# Patient Record
Sex: Male | Born: 1950 | Race: Black or African American | Hispanic: No | Smoking: Former smoker
Health system: Southern US, Community
[De-identification: ages and names within clinical notes are randomized; demographics above are authoritative.]

## PROBLEM LIST (undated history)

## (undated) DIAGNOSIS — G43909 Migraine, unspecified, not intractable, without status migrainosus: Secondary | ICD-10-CM

## (undated) DIAGNOSIS — I10 Essential (primary) hypertension: Secondary | ICD-10-CM

## (undated) DIAGNOSIS — I509 Heart failure, unspecified: Secondary | ICD-10-CM

---

## 2013-07-22 ENCOUNTER — Emergency Department (HOSPITAL_BASED_OUTPATIENT_CLINIC_OR_DEPARTMENT_OTHER)
Admission: EM | Admit: 2013-07-22 | Discharge: 2013-07-22 | Disposition: A | Discharge status: Home / Self Care | Payer: Medicare Other | Attending: Emergency Medicine | Admitting: Emergency Medicine

## 2013-07-22 ENCOUNTER — Encounter (HOSPITAL_BASED_OUTPATIENT_CLINIC_OR_DEPARTMENT_OTHER): Payer: Self-pay | Admitting: Emergency Medicine

## 2013-07-22 ENCOUNTER — Emergency Department (HOSPITAL_BASED_OUTPATIENT_CLINIC_OR_DEPARTMENT_OTHER): Payer: Medicare Other

## 2013-07-22 DIAGNOSIS — Y929 Unspecified place or not applicable: Secondary | ICD-10-CM | POA: Insufficient documentation

## 2013-07-22 DIAGNOSIS — F17200 Nicotine dependence, unspecified, uncomplicated: Secondary | ICD-10-CM | POA: Insufficient documentation

## 2013-07-22 DIAGNOSIS — Y939 Activity, unspecified: Secondary | ICD-10-CM | POA: Insufficient documentation

## 2013-07-22 DIAGNOSIS — W010XXA Fall on same level from slipping, tripping and stumbling without subsequent striking against object, initial encounter: Secondary | ICD-10-CM | POA: Insufficient documentation

## 2013-07-22 DIAGNOSIS — S8000XA Contusion of unspecified knee, initial encounter: Secondary | ICD-10-CM | POA: Insufficient documentation

## 2013-07-22 MED ORDER — HYDROCODONE-ACETAMINOPHEN 5-325 MG PO TABS: 1.0000 | ORAL_TABLET | Freq: Four times a day (QID) | ORAL | Status: DC | PRN

## 2013-07-22 MED ORDER — IBUPROFEN 600 MG PO TABS: 600.0000 mg | ORAL_TABLET | Freq: Four times a day (QID) | ORAL | Status: DC | PRN

## 2013-07-22 NOTE — ED Notes (Signed)
Pt tripped and fell to knees yesterday on cement.  Knee pain started today.  Worse on right.  Pt walking unassisted without difficulty.

## 2013-07-22 NOTE — ED Provider Notes (Signed)
CSN: [id number]     Arrival date & time 07/22/13  2020 History  This chart was scribed for Shon Baton, MD by Danella Maiers, ED Scribe. This patient was seen in room MH07/MH07 and the patient's care was started at 8:37 PM.   Chief Complaint  Patient presents with  . Fall  . Knee Pain   The history is provided by the patient. No language interpreter was used.   HPI Comments: Andrew Warner is a 62 y.o. male who presents to the Emergency Department complaining of bilateral knee pain, worse on the right, since tripping and falling onto cement yesterday. He rates the severity of his pain as 10/10. He tried taking Aleve with no relief. He is able to ambulate fine. He denies pain before the fall. He denies injury anywhere else. He denies LOC. He is not on any blood thinners. He is otherwise healthy. He is not on any medications currently. He has no allergies to medications.   History reviewed. No pertinent past medical history. History reviewed. No pertinent past surgical history. No family history on file. History  Substance Use Topics  . Smoking status: Current Some Day Smoker  . Smokeless tobacco: Not on file  . Alcohol Use: Yes     Comment: occ    Review of Systems  Musculoskeletal: Positive for arthralgias (knee).  Neurological: Negative for syncope.    Allergies  Review of patient's allergies indicates no known allergies.  Home Medications   Current Outpatient Rx  Name  Route  Sig  Dispense  Refill  . HYDROcodone-acetaminophen (NORCO/VICODIN) 5-325 MG per tablet   Oral   Take 1 tablet by mouth every 6 (six) hours as needed.   10 tablet   0   . ibuprofen (ADVIL,MOTRIN) 600 MG tablet   Oral   Take 1 tablet (600 mg total) by mouth every 6 (six) hours as needed.   30 tablet   0    BP 163/91  Pulse 80  Temp(Src) 98.3 F (36.8 C) (Oral)  Resp 18  Ht 6\' 3"  (1.905 m)  Wt 225 lb (102.059 kg)  BMI 28.12 kg/m2  SpO2 99% Physical Exam  Nursing note and vitals  reviewed. Constitutional: He is oriented to person, place, and time. He appears well-developed and well-nourished.  HENT:  Head: Normocephalic and atraumatic.  Eyes: Pupils are equal, round, and reactive to light.  Neck: Neck supple.  Cardiovascular: Normal rate, regular rhythm and normal heart sounds.   No murmur heard. Pulmonary/Chest: Effort normal and breath sounds normal. No respiratory distress. He has no wheezes.  Abdominal: Soft. Bowel sounds are normal. There is no tenderness. There is no rebound.  Musculoskeletal: He exhibits no edema.  Examination of the bilateral knees shows full range of motion. There is a small abrasion over the left knee. No evidence of swelling or effusion.  Lymphadenopathy:    He has no cervical adenopathy.  Neurological: He is alert and oriented to person, place, and time.  Skin: Skin is warm and dry.  Psychiatric: He has a normal mood and affect.    ED Course  Procedures (including critical care time) Medications - No data to display  DIAGNOSTIC STUDIES: Oxygen Saturation is 99% on RA, normal by my interpretation.    COORDINATION OF CARE: 8:49 PM- Discussed treatment plan with pt which includes x-ray and pain medication. Pt agrees to plan.    Labs Review Labs Reviewed - No data to display Imaging Review Dg Knee Complete 4 Views Left  07/22/2013   CLINICAL DATA:  Left knee pain after fall yesterday.  EXAM: LEFT KNEE - COMPLETE 4+ VIEW  COMPARISON:  None.  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 No acute fracture or dislocation. No joint effusion.  IMPRESSION: Degenerative change, without acute osseous finding.   Electronically Signed   By: Jeronimo Greaves M.D.   On: 07/22/2013 21:15   Dg Knee Complete 4 Views Right  07/22/2013   CLINICAL DATA:  Pain after fall yesterday.  EXAM: RIGHT KNEE - COMPLETE 4+ VIEW  COMPARISON:  None.  FINDINGS: Mild medial and lateral compartment  joint space narrowing and osteophyte formation. Mild to moderate patellofemoral osteoarthritis. No acute fracture or dislocation. No joint effusion.  IMPRESSION: Degenerative change, without acute osseous finding.   Electronically Signed   By: Jeronimo Greaves M.D.   On: 07/22/2013 21:16    EKG Interpretation   None       MDM   1. Knee contusion, unspecified laterality, initial encounter    Patient presents with bilateral knee pain following a fall. He is otherwise nontoxic-appearing. He has no evidence of deformity, effusion, or decreased range of motion of bilateral knees. Plain films are negative. Patient is driving so was not given any pain medication in the ED. He will be sent home with a prescription for ibuprofen and Norco. I encouraged RICE therapy.  After history, exam, and medical workup I feel the patient has been appropriately medically screened and is safe for discharge home. Pertinent diagnoses were discussed with the patient. Patient was given return precautions.     I personally performed the services described in this documentation, which was scribed in my presence. The recorded information has been reviewed and is accurate.   Shon Baton, MD 07/23/13 [phone]

## 2016-02-13 ENCOUNTER — Emergency Department (HOSPITAL_BASED_OUTPATIENT_CLINIC_OR_DEPARTMENT_OTHER): Payer: Medicare Other

## 2016-02-13 ENCOUNTER — Emergency Department (HOSPITAL_BASED_OUTPATIENT_CLINIC_OR_DEPARTMENT_OTHER)
Admission: EM | Admit: 2016-02-13 | Discharge: 2016-02-13 | Disposition: A | Discharge status: Home / Self Care | Payer: Medicare Other | Attending: Emergency Medicine | Admitting: Emergency Medicine

## 2016-02-13 ENCOUNTER — Encounter (HOSPITAL_BASED_OUTPATIENT_CLINIC_OR_DEPARTMENT_OTHER): Payer: Self-pay | Admitting: Emergency Medicine

## 2016-02-13 DIAGNOSIS — G44039 Episodic paroxysmal hemicrania, not intractable: Secondary | ICD-10-CM | POA: Diagnosis not present

## 2016-02-13 DIAGNOSIS — Z79899 Other long term (current) drug therapy: Secondary | ICD-10-CM | POA: Insufficient documentation

## 2016-02-13 DIAGNOSIS — F17200 Nicotine dependence, unspecified, uncomplicated: Secondary | ICD-10-CM | POA: Insufficient documentation

## 2016-02-13 DIAGNOSIS — R51 Headache: Secondary | ICD-10-CM | POA: Diagnosis present

## 2016-02-13 MED ORDER — KETOROLAC TROMETHAMINE 60 MG/2ML IM SOLN
60.0000 mg | Freq: Once | INTRAMUSCULAR | Status: AC
  Administered 2016-02-13: 60 mg via INTRAMUSCULAR
  Filled 2016-02-13: qty 2

## 2016-02-13 MED ORDER — TRAMADOL HCL 50 MG PO TABS: 50.0000 mg | ORAL_TABLET | Freq: Four times a day (QID) | ORAL | Status: AC | PRN

## 2016-02-13 NOTE — ED Notes (Signed)
MD at bedside. 

## 2016-02-13 NOTE — ED Notes (Addendum)
Pt intermittent HA x 4 days. Pt describes pain as throbbing on the top of his head. Pt denies blurred vision or dizziness. Pt states pain eases with tylenol but returns. Pt denies weakness in arms or legs, denies N/V

## 2016-02-13 NOTE — ED Provider Notes (Signed)
CSN: [id number]     Arrival date & time 02/13/16  0744 History   First MD Initiated Contact with Patient 02/13/16 [phone]     Chief Complaint  Patient presents with  . Headache     (Consider location/radiation/quality/duration/timing/severity/associated sxs/prior Treatment) HPI Comments: Patient presents with a headache. He has remote history of esophageal cancer. He states she's been in remission for the last 8 years. He states over the last 4 days he's had ongoing headache. It wakes him up at night. It eases off the Tylenol but then comes back. He denies any vision changes. No nausea or vomiting. No photophobia. No numbness or weakness to his extremities. No ataxia. No recent head trauma. No URI symptoms.  Patient is a 65 y.o. male presenting with headaches.  Headache Associated symptoms: no abdominal pain, no back pain, no congestion, no cough, no diarrhea, no dizziness, no fatigue, no fever, no nausea, no numbness, no vomiting and no weakness     History reviewed. No pertinent past medical history. History reviewed. No pertinent past surgical history. No family history on file. Social History  Substance Use Topics  . Smoking status: Current Some Day Smoker  . Smokeless tobacco: None  . Alcohol Use: Yes     Comment: occ    Review of Systems  Constitutional: Negative for fever, chills, diaphoresis and fatigue.  HENT: Negative for congestion, rhinorrhea and sneezing.   Eyes: Negative.   Respiratory: Negative for cough, chest tightness and shortness of breath.   Cardiovascular: Negative for chest pain and leg swelling.  Gastrointestinal: Negative for nausea, vomiting, abdominal pain, diarrhea and blood in stool.  Genitourinary: Negative for frequency, hematuria, flank pain and difficulty urinating.  Musculoskeletal: Negative for back pain and arthralgias.  Skin: Negative for rash.  Neurological: Positive for headaches. Negative for dizziness, speech difficulty, weakness and  numbness.      Allergies  Review of patient's allergies indicates no known allergies.  Home Medications   Prior to Admission medications   Medication Sig Start Date End Date Taking? Authorizing Provider  varenicline (CHANTIX PAK) 0.5 MG X 11 & 1 MG X 42 tablet Take by mouth 2 (two) times daily. Take one 0.5 mg tablet by mouth once daily for 3 days, then increase to one 0.5 mg tablet twice daily for 4 days, then increase to one 1 mg tablet twice daily.   Yes Historical Provider, MD  HYDROcodone-acetaminophen (NORCO/VICODIN) 5-325 MG per tablet Take 1 tablet by mouth every 6 (six) hours as needed. 07/22/13   Shon Batonourtney F Horton, MD  ibuprofen (ADVIL,MOTRIN) 600 MG tablet Take 1 tablet (600 mg total) by mouth every 6 (six) hours as needed. 07/22/13   Shon Batonourtney F Horton, MD   BP 161/88 mmHg  Pulse 69  Temp(Src) 98.2 F (36.8 C) (Oral)  Resp 16  Ht 6\' 3"  (1.905 m)  Wt 230 lb (104.327 kg)  BMI 28.75 kg/m2  SpO2 98% Physical Exam  Constitutional: He is oriented to person, place, and time. He appears well-developed and well-nourished.  HENT:  Head: Normocephalic and atraumatic.  Eyes: Pupils are equal, round, and reactive to light.  Neck: Normal range of motion. Neck supple.  Cardiovascular: Normal rate, regular rhythm and normal heart sounds.   Pulmonary/Chest: Effort normal and breath sounds normal. No respiratory distress. He has no wheezes. He has no rales. He exhibits no tenderness.  Abdominal: Soft. Bowel sounds are normal. There is no tenderness. There is no rebound and no guarding.  Musculoskeletal: Normal range of  motion. He exhibits no edema.  Lymphadenopathy:    He has no cervical adenopathy.  Neurological: He is alert and oriented to person, place, and time.  Motor 5/5 all extremities, sensation intact to LT all extremities, FTN intact, no pronator drift.  Gait normal.  CN 2-12 grossly intact  Skin: Skin is warm and dry. No rash noted.  Psychiatric: He has a normal mood and  affect.    ED Course  Procedures (including critical care time) Labs Review Labs Reviewed - No data to display  Imaging Review Ct Head Wo Contrast  02/13/2016  CLINICAL DATA:  Headaches at vertex for 4 days, smoker, remote history esophageal cancer EXAM: CT HEAD WITHOUT CONTRAST TECHNIQUE: Contiguous axial images were obtained from the base of the skull through the vertex without intravenous contrast. COMPARISON:  None FINDINGS: Normal ventricular morphology. No midline shift or mass effect. Normal appearance of brain parenchyma. No intracranial hemorrhage, mass lesion or evidence acute infarction. No extra-axial fluid collections. Paranasal sinuses mastoid air cells clear. Bones unremarkable. Scattered nonspecific subcutaneous nodularity of scalp at vertex. IMPRESSION: No acute intracranial abnormalities. Electronically Signed   By: Ulyses SouthwardMark  Boles M.D.   On: 02/13/2016 09:59   I have personally reviewed and evaluated these images and lab results as part of my medical decision-making.   EKG Interpretation None      MDM   Final diagnoses:  Nonintractable paroxysmal hemicrania, unspecified chronicity pattern    Patient presents with a four-day history of a headache, right-sided. There is no neurologic deficits. No suggestions of subarachnoid hemorrhage or meningitis. Given his remote history of cancer, I did do a head CT which was negative. He was given dose of Toradol in the ED and feels better after this. His blood pressure is mildly elevated and I advised him he needs to follow-up with his PCP to have this rechecked. Return precautions were given.    Rolan BuccoMelanie Nation Cradle, MD 02/13/16 [phone]

## 2016-02-14 ENCOUNTER — Emergency Department (HOSPITAL_BASED_OUTPATIENT_CLINIC_OR_DEPARTMENT_OTHER)
Admission: EM | Admit: 2016-02-14 | Discharge: 2016-02-14 | Disposition: A | Discharge status: Home / Self Care | Payer: Medicare Other | Attending: Emergency Medicine | Admitting: Emergency Medicine

## 2016-02-14 ENCOUNTER — Encounter (HOSPITAL_BASED_OUTPATIENT_CLINIC_OR_DEPARTMENT_OTHER): Payer: Self-pay | Admitting: *Deleted

## 2016-02-14 DIAGNOSIS — G44019 Episodic cluster headache, not intractable: Secondary | ICD-10-CM | POA: Diagnosis not present

## 2016-02-14 DIAGNOSIS — R51 Headache: Secondary | ICD-10-CM | POA: Diagnosis present

## 2016-02-14 DIAGNOSIS — F17200 Nicotine dependence, unspecified, uncomplicated: Secondary | ICD-10-CM | POA: Insufficient documentation

## 2016-02-14 LAB — CBC WITH DIFFERENTIAL/PLATELET
BASOS ABS: 0 10*3/uL (ref 0.0–0.1)
BASOS PCT: 0 %
EOS ABS: 0.2 10*3/uL (ref 0.0–0.7)
Eosinophils Relative: 3 %
HCT: 42.1 % (ref 39.0–52.0)
HEMOGLOBIN: 14.1 g/dL (ref 13.0–17.0)
Lymphocytes Relative: 39 %
Lymphs Abs: 2.8 10*3/uL (ref 0.7–4.0)
MCH: 33.6 pg (ref 26.0–34.0)
MCHC: 33.5 g/dL (ref 30.0–36.0)
MCV: 100.2 fL — ABNORMAL HIGH (ref 78.0–100.0)
MONOS PCT: 9 %
Monocytes Absolute: 0.7 10*3/uL (ref 0.1–1.0)
NEUTROS ABS: 3.5 10*3/uL (ref 1.7–7.7)
Neutrophils Relative %: 49 %
Platelets: 185 10*3/uL (ref 150–400)
RBC: 4.2 MIL/uL — ABNORMAL LOW (ref 4.22–5.81)
RDW: 12 % (ref 11.5–15.5)
WBC: 7.3 10*3/uL (ref 4.0–10.5)

## 2016-02-14 LAB — BASIC METABOLIC PANEL
ANION GAP: 8 (ref 5–15)
BUN: 17 mg/dL (ref 6–20)
CALCIUM: 8.7 mg/dL — AB (ref 8.9–10.3)
CO2: 27 mmol/L (ref 22–32)
CREATININE: 1.23 mg/dL (ref 0.61–1.24)
Chloride: 102 mmol/L (ref 101–111)
Glucose, Bld: 118 mg/dL — ABNORMAL HIGH (ref 65–99)
Potassium: 4.2 mmol/L (ref 3.5–5.1)
Sodium: 137 mmol/L (ref 135–145)

## 2016-02-14 MED ORDER — KETOROLAC TROMETHAMINE 15 MG/ML IJ SOLN
15.0000 mg | Freq: Once | INTRAMUSCULAR | Status: AC
  Administered 2016-02-14: 15 mg via INTRAVENOUS
  Filled 2016-02-14: qty 1

## 2016-02-14 NOTE — ED Notes (Signed)
O2 applied via [facility]B 12L for HA treatment and not respiritory decline.

## 2016-02-14 NOTE — ED Notes (Signed)
Dr. Molpus in to see pt 

## 2016-02-14 NOTE — ED Notes (Addendum)
C/o HA, onset 1-2 hrs ago, seen here yesterday for the same, no relief with 1-200mg  ibuprofen taken at 0030. (denies: fever, nvd, blurred vision, dizziness, light sensitivity, dental pain, sinus issues or other sx or hx).  Rates 10-10. Family here with pt (in w/r). 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

## 2016-02-14 NOTE — ED Provider Notes (Signed)
CSN: [id number]     Arrival date & time 02/14/16  0450 History   First MD Initiated Contact with Patient 02/14/16 0510     Chief Complaint  Patient presents with  . Headache     (Consider location/radiation/quality/duration/timing/severity/associated sxs/prior Treatment) HPI  This is a 65 year old male without a long-standing history of headaches. He has been having headaches for the last 5 days. The headaches usually occur in the evening. The pain is located in the right parietal region. He describes the pain as severe.   He is here with a headache that began about 1 AM. He took 200 milligrams of ibuprofen without relief. The pain is again located in the right parietal region. His scalp is not tender to touch. There is no associated photophobia, blurred vision, nausea, vomiting, numbness or weakness.  He was seen yesterday for the same and had relief with IM Toradol.   History reviewed. No pertinent past medical history. History reviewed. No pertinent past surgical history. History reviewed. No pertinent family history. Social History  Substance Use Topics  . Smoking status: Current Some Day Smoker  . Smokeless tobacco: None  . Alcohol Use: Yes     Comment: occ    Review of Systems  All other systems reviewed and are negative.   Allergies  Review of patient's allergies indicates no known allergies.  Home Medications   Prior to Admission medications   Medication Sig Start Date End Date Taking? Authorizing Provider  HYDROcodone-acetaminophen (NORCO/VICODIN) 5-325 MG per tablet Take 1 tablet by mouth every 6 (six) hours as needed. 07/22/13   Shon Batonourtney F Horton, MD  ibuprofen (ADVIL,MOTRIN) 600 MG tablet Take 1 tablet (600 mg total) by mouth every 6 (six) hours as needed. 07/22/13   Shon Batonourtney F Horton, MD  traMADol (ULTRAM) 50 MG tablet Take 1 tablet (50 mg total) by mouth every 6 (six) hours as needed. 02/13/16   Rolan BuccoMelanie Belfi, MD  varenicline (CHANTIX PAK) 0.5 MG X 11 & 1 MG X 42  tablet Take by mouth 2 (two) times daily. Take one 0.5 mg tablet by mouth once daily for 3 days, then increase to one 0.5 mg tablet twice daily for 4 days, then increase to one 1 mg tablet twice daily.    Historical Provider, MD   BP 153/91 mmHg  Pulse 74  Temp(Src) 97.9 F (36.6 C) (Oral)  Resp 18  Ht 6\' 3"  (1.905 m)  Wt 230 lb (104.327 kg)  BMI 28.75 kg/m2  SpO2 95%   Physical Exam  General: Well-developed, well-nourished male in no acute distress; appearance consistent with age of record HENT: normocephalic; atraumatic; scalp nontender Eyes: pupils equal, round and reactive to light; extraocular muscles intact Neck: supple Heart: regular rate and rhythm Lungs: clear to auscultation bilaterally Abdomen: soft; nondistended; nontender; bowel sounds present Extremities: No deformity; full range of motion; pulses normal Neurologic: Awake, alert and oriented; motor function intact in all extremities and symmetric; no facial droop; normal coordination and speech Skin: Warm and dry Psychiatric: Normal mood and affect    ED Course  Procedures (including critical care time)   MDM  Nursing notes and vitals signs, including pulse oximetry, reviewed.  Summary of this visit's results, reviewed by myself:  Labs:  Results for orders placed or performed during the hospital encounter of 02/14/16 (from the past 24 hour(s))  Basic metabolic panel     Status: Abnormal   Collection Time: 02/14/16  5:25 AM  Result Value Ref Range   Sodium 137  135 - 145 mmol/L   Potassium 4.2 3.5 - 5.1 mmol/L   Chloride 102 101 - 111 mmol/L   CO2 27 22 - 32 mmol/L   Glucose, Bld 118 (H) 65 - 99 mg/dL   BUN 17 6 - 20 mg/dL   Creatinine, Ser 6.961.23 0.61 - 1.24 mg/dL   Calcium 8.7 (L) 8.9 - 10.3 mg/dL   GFR calc non Af Amer >60 >60 mL/min   GFR calc Af Amer >60 >60 mL/min   Anion gap 8 5 - 15  CBC with Differential/Platelet     Status: Abnormal   Collection Time: 02/14/16  5:25 AM  Result Value Ref Range    WBC 7.3 4.0 - 10.5 K/uL   RBC 4.20 (L) 4.22 - 5.81 MIL/uL   Hemoglobin 14.1 13.0 - 17.0 g/dL   HCT 29.542.1 28.439.0 - 13.252.0 %   MCV 100.2 (H) 78.0 - 100.0 fL   MCH 33.6 26.0 - 34.0 pg   MCHC 33.5 30.0 - 36.0 g/dL   RDW 44.012.0 10.211.5 - 72.515.5 %   Platelets 185 150 - 400 K/uL   Neutrophils Relative % 49 %   Neutro Abs 3.5 1.7 - 7.7 K/uL   Lymphocytes Relative 39 %   Lymphs Abs 2.8 0.7 - 4.0 K/uL   Monocytes Relative 9 %   Monocytes Absolute 0.7 0.1 - 1.0 K/uL   Eosinophils Relative 3 %   Eosinophils Absolute 0.2 0.0 - 0.7 K/uL   Basophils Relative 0 %   Basophils Absolute 0.0 0.0 - 0.1 K/uL    Imaging Studies: Ct Head Wo Contrast  02/13/2016  CLINICAL DATA:  Headaches at vertex for 4 days, smoker, remote history esophageal cancer EXAM: CT HEAD WITHOUT CONTRAST TECHNIQUE: Contiguous axial images were obtained from the base of the skull through the vertex without intravenous contrast. COMPARISON:  None FINDINGS: Normal ventricular morphology. No midline shift or mass effect. Normal appearance of brain parenchyma. No intracranial hemorrhage, mass lesion or evidence acute infarction. No extra-axial fluid collections. Paranasal sinuses mastoid air cells clear. Bones unremarkable. Scattered nonspecific subcutaneous nodularity of scalp at vertex. IMPRESSION: No acute intracranial abnormalities. Electronically Signed   By: Ulyses SouthwardMark  Boles M.D.   On: 02/13/2016 09:59   6:02 AM Patient's pain significantly improved after IV Toradol and oxygen by nonrebreather. I suspect these may represent cluster headaches as there are new onset, unilateral and occur at about the same time of day. The patient was advised of this and will contact his tremor care physician regarding longer term treatment. As sumatriptan has the potential for significant side effects I prefer that he be managed by the physician that knows him.    Paula LibraJohn Abbee Cremeens, MD 02/14/16 [phone]

## 2016-02-14 NOTE — ED Notes (Signed)
Dr. Molpus at BS 

## 2016-02-24 ENCOUNTER — Encounter (HOSPITAL_BASED_OUTPATIENT_CLINIC_OR_DEPARTMENT_OTHER): Payer: Self-pay | Admitting: *Deleted

## 2016-02-24 ENCOUNTER — Emergency Department (HOSPITAL_BASED_OUTPATIENT_CLINIC_OR_DEPARTMENT_OTHER)
Admission: EM | Admit: 2016-02-24 | Discharge: 2016-02-24 | Disposition: A | Discharge status: Home / Self Care | Payer: Medicare Other | Attending: Emergency Medicine | Admitting: Emergency Medicine

## 2016-02-24 DIAGNOSIS — F17200 Nicotine dependence, unspecified, uncomplicated: Secondary | ICD-10-CM | POA: Insufficient documentation

## 2016-02-24 DIAGNOSIS — R51 Headache: Secondary | ICD-10-CM | POA: Insufficient documentation

## 2016-02-24 DIAGNOSIS — R519 Headache, unspecified: Secondary | ICD-10-CM

## 2016-02-24 HISTORY — DX: Migraine, unspecified, not intractable, without status migrainosus: G43.909

## 2016-02-24 MED ORDER — SUMATRIPTAN SUCCINATE 50 MG PO TABS: 50.0000 mg | ORAL_TABLET | ORAL | Status: AC | PRN

## 2016-02-24 MED ORDER — SODIUM CHLORIDE 0.9 % IV BOLUS (SEPSIS)
1000.0000 mL | Freq: Once | INTRAVENOUS | Status: AC
  Administered 2016-02-24: 1000 mL via INTRAVENOUS

## 2016-02-24 MED ORDER — METOCLOPRAMIDE HCL 5 MG/ML IJ SOLN
10.0000 mg | Freq: Once | INTRAMUSCULAR | Status: AC
  Administered 2016-02-24: 10 mg via INTRAVENOUS
  Filled 2016-02-24: qty 2

## 2016-02-24 MED ORDER — KETOROLAC TROMETHAMINE 30 MG/ML IJ SOLN
30.0000 mg | Freq: Once | INTRAMUSCULAR | Status: AC
  Administered 2016-02-24: 30 mg via INTRAVENOUS
  Filled 2016-02-24: qty 1
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
## 2016-02-24 NOTE — Discharge Instructions (Signed)
You have been given referral to neurology for outpatient workup and management of her headaches. Return without fail for worsening symptoms, including fever, confusion, vision or speech changes, difficulty walking, new numbness or weakness, vomiting unable to keep down food or fluids, or any other symptoms concerning to you.  General Headache Without Cause A headache is pain or discomfort felt around the head or neck area. There are many causes and types of headaches. In some cases, the cause may not be found.  HOME CARE  Managing Pain  Take over-the-counter and prescription medicines only as told by your doctor.  Lie down in a dark, quiet room when you have a headache.  If directed, apply ice to the head and neck area:  Put ice in a plastic bag.  Place a towel between your skin and the bag.  Leave the ice on for 20 minutes, 2-3 times per day.  Use a heating pad or hot shower to apply heat to the head and neck area as told by your doctor.  Keep lights dim if bright lights bother you or make your headaches worse. Eating and Drinking  Eat meals on a regular schedule.  Lessen how much alcohol you drink.  Lessen how much caffeine you drink, or stop drinking caffeine. General Instructions  Keep all follow-up visits as told by your doctor. This is important.  Keep a journal to find out if certain things bring on headaches. For example, write down:  What you eat and drink.  How much sleep you get.  Any change to your diet or medicines.  Relax by getting a massage or doing other relaxing activities.  Lessen stress.  Sit up straight. Do not tighten (tense) your muscles.  Do not use tobacco products. This includes cigarettes, chewing tobacco, or e-cigarettes. If you need help quitting, ask your doctor.  Exercise regularly as told by your doctor.  Get enough sleep. This often means 7-9 hours of sleep. GET HELP IF:  Your symptoms are not helped by medicine.  You have a  headache that feels different than the other headaches.  You feel sick to your stomach (nauseous) or you throw up (vomit).  You have a fever. GET HELP RIGHT AWAY IF:   Your headache becomes really bad.  You keep throwing up.  You have a stiff neck.  You have trouble seeing.  You have trouble speaking.  You have pain in the eye or ear.  Your muscles are weak or you lose muscle control.  You lose your balance or have trouble walking.  You feel like you will pass out (faint) or you pass out.  You have confusion.   This information is not intended to replace advice given to you by your health care provider. Make sure you discuss any questions you have with your health care provider.   Document Released: 05/16/2008 Document Revised: 04/28/2015 Document Reviewed: 11/30/2014 Elsevier Interactive Patient Education [employer]2016 [employer] Inc.

## 2016-02-25 ENCOUNTER — Telehealth (HOSPITAL_BASED_OUTPATIENT_CLINIC_OR_DEPARTMENT_OTHER): Payer: Self-pay | Admitting: *Deleted

## 2016-02-25 NOTE — ED Notes (Signed)
Pt called with concerns trying to schedule f/u appt with [facility]. This RN called office and spoke with Dawn who said they could schedule him for an appt in September. Pt notified to call [facility] back and schedule soonest available appointment and to return to ED for concerning symptoms if needed.

## 2016-03-02 ENCOUNTER — Emergency Department (HOSPITAL_BASED_OUTPATIENT_CLINIC_OR_DEPARTMENT_OTHER): Payer: Medicare Other

## 2016-03-02 ENCOUNTER — Emergency Department (HOSPITAL_BASED_OUTPATIENT_CLINIC_OR_DEPARTMENT_OTHER)
Admission: EM | Admit: 2016-03-02 | Discharge: 2016-03-02 | Disposition: A | Discharge status: Home / Self Care | Payer: Medicare Other | Attending: Physician Assistant | Admitting: Physician Assistant

## 2016-03-02 ENCOUNTER — Encounter (HOSPITAL_BASED_OUTPATIENT_CLINIC_OR_DEPARTMENT_OTHER): Payer: Self-pay

## 2016-03-02 DIAGNOSIS — F17210 Nicotine dependence, cigarettes, uncomplicated: Secondary | ICD-10-CM | POA: Insufficient documentation

## 2016-03-02 DIAGNOSIS — G8929 Other chronic pain: Secondary | ICD-10-CM | POA: Diagnosis not present

## 2016-03-02 DIAGNOSIS — M25561 Pain in right knee: Secondary | ICD-10-CM

## 2016-03-02 DIAGNOSIS — S8991XA Unspecified injury of right lower leg, initial encounter: Secondary | ICD-10-CM | POA: Diagnosis not present

## 2016-03-02 DIAGNOSIS — Y92410 Unspecified street and highway as the place of occurrence of the external cause: Secondary | ICD-10-CM | POA: Insufficient documentation

## 2016-03-02 DIAGNOSIS — Y9389 Activity, other specified: Secondary | ICD-10-CM | POA: Insufficient documentation

## 2016-03-02 DIAGNOSIS — Y999 Unspecified external cause status: Secondary | ICD-10-CM | POA: Insufficient documentation

## 2016-03-02 MED ORDER — DICLOFENAC SODIUM 1 % TD GEL: 4.0000 g | Freq: Four times a day (QID) | TRANSDERMAL | Status: DC

## 2016-03-02 NOTE — ED Notes (Signed)
Pt has ambulated in hall, back to room and over to xray without any difficulty.
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
## 2016-03-02 NOTE — ED Notes (Signed)
PA at bedside.

## 2016-03-02 NOTE — Discharge Instructions (Signed)
You have been seen today for knee pain. Your imaging showed no acute abnormalities, mostly just evidence of arthritic changes. Follow up with orthopedics as soon as possible for reevaluation and chronic management. Use a knee sleeve for comfort. Elevate the extremity whenever possible. Apply ice and diclofenac gel to reduce inflammation and pain. Follow up with PCP as needed. Return to ED should any concerning symptoms arise.

## 2016-03-02 NOTE — ED Notes (Signed)
Patient transported to X-ray ambulatory with tech. 

## 2016-03-20 ENCOUNTER — Encounter (HOSPITAL_BASED_OUTPATIENT_CLINIC_OR_DEPARTMENT_OTHER): Payer: Self-pay | Admitting: *Deleted

## 2016-03-20 ENCOUNTER — Emergency Department (HOSPITAL_BASED_OUTPATIENT_CLINIC_OR_DEPARTMENT_OTHER)
Admission: EM | Admit: 2016-03-20 | Discharge: 2016-03-20 | Disposition: A | Discharge status: Home / Self Care | Payer: Medicare Other | Attending: Emergency Medicine | Admitting: Emergency Medicine

## 2016-03-20 DIAGNOSIS — F17210 Nicotine dependence, cigarettes, uncomplicated: Secondary | ICD-10-CM | POA: Insufficient documentation

## 2016-03-20 DIAGNOSIS — Z791 Long term (current) use of non-steroidal anti-inflammatories (NSAID): Secondary | ICD-10-CM | POA: Insufficient documentation

## 2016-03-20 DIAGNOSIS — M25561 Pain in right knee: Secondary | ICD-10-CM

## 2016-03-20 MED ORDER — IBUPROFEN 800 MG PO TABS: 800.0000 mg | ORAL_TABLET | Freq: Three times a day (TID) | ORAL | 0 refills | Status: AC

## 2016-03-20 MED ORDER — HYDROCODONE-ACETAMINOPHEN 5-325 MG PO TABS: 1.0000 | ORAL_TABLET | Freq: Four times a day (QID) | ORAL | 0 refills | Status: AC | PRN

## 2016-03-20 MED FILL — HYDROCODON-APAP 5-325: 5-325 | 2 days supply | Qty: 5 | Fill #0

## 2016-03-20 MED FILL — IBUPROFEN 800 MG TABLET: 800 | 10 days supply | Qty: 30 | Fill #0

## 2016-03-20 NOTE — ED Triage Notes (Signed)
Pt reports R knee pain that he was evaluated for 2wks ago. States he has an appt with an orthopedic doctor next week. Reports pain meds (Tramadol, Motrin) ineffective. Denies numbness/tingling, swelling. Pt able to ambulate.

## 2016-03-20 NOTE — ED Notes (Signed)
MD at bedside. 

## 2016-03-20 NOTE — Discharge Instructions (Signed)
Take motrin for pain.   Stay off your leg and use crutches at home as needed.  Take vicodin for severe pain. Do NOT drive with it.   See your orthopedic doctor next week.   Return to ER if you have severe pain, unable to walk, worse knee swelling.

## 2016-03-20 NOTE — ED Provider Notes (Signed)
[facility]   CSN: [id number] Arrival date & time: 03/20/16  5638  First Provider Contact:  First MD Initiated Contact with Patient 03/20/16 0800        History   Chief Complaint Chief Complaint  Patient presents with  . Knee Pain    HPI Andrew Warner is a 65 y.o. male.  The history is provided by the patient.  Andrew Warner is a 65 y.o. male history of arthritis, migraines here presenting with right knee pain. Has chronic right knee pain. Was seen in the ED about 2 weeks ago was prescribed diclofenac, tramadol with minimal relief. Since yesterday, he states that the pain got acutely worse and he has pain when he bears weight on it. Denies any joint swelling or fevers.  Has orthopedic follow-up next week. Denies any trauma or injury.     Past Medical History:  Diagnosis Date  . Migraines     There are no active problems to display for this patient.   History reviewed. No pertinent surgical history.     Home Medications    Prior to Admission medications   Medication Sig Start Date End Date Taking? Authorizing Provider  diclofenac sodium (VOLTAREN) 1 % GEL Apply 4 g topically 4 (four) times daily. 03/02/16  Yes Shawn C Joy, PA-C  ibuprofen (ADVIL,MOTRIN) 600 MG tablet Take 1 tablet (600 mg total) by mouth every 6 (six) hours as needed. 07/22/13  Yes Shon Baton, MD  SUMAtriptan (IMITREX) 50 MG tablet Take 1 tablet (50 mg total) by mouth every 2 (two) hours as needed for migraine. May repeat in 2 hours if headache persists or recurs. 02/24/16  Yes Lavera Guise, MD  traMADol (ULTRAM) 50 MG tablet Take 1 tablet (50 mg total) by mouth every 6 (six) hours as needed. 02/13/16  Yes Rolan Bucco, MD  HYDROcodone-acetaminophen (NORCO/VICODIN) 5-325 MG per tablet Take 1 tablet by mouth every 6 (six) hours as needed. 07/22/13   Shon Baton, MD  varenicline (CHANTIX PAK) 0.5 MG X 11 & 1 MG X 42 tablet Take by mouth 2 (two) times daily. Take one 0.5 mg  tablet by mouth once daily for 3 days, then increase to one 0.5 mg tablet twice daily for 4 days, then increase to one 1 mg tablet twice daily.    Historical Provider, MD    Family History No family history on file.  Social History Social History  Substance Use Topics  . Smoking status: Current Some Day Smoker    Packs/day: 1.00    Types: Cigarettes  . Smokeless tobacco: Never Used  . Alcohol use Yes     Comment: several times/week     Allergies   Review of patient's allergies indicates no known allergies.   Review of Systems Review of Systems  Musculoskeletal:       R knee pain   All other systems reviewed and are negative.    Physical Exam Updated Vital Signs BP 149/84 (BP Location: Left Arm)   Pulse 77   Temp 98.2 F (36.8 C) (Oral)   Resp 18   Ht  (1.905 m)   Wt 230 lb (104.3 kg)   SpO2 97%   BMI 28.75 kg/m   Physical Exam  Constitutional: He is oriented to person, place, and time. He appears well-developed.  HENT:  Head: Normocephalic.  Eyes: Pupils are equal, round, and reactive to light.  Neck: Normal range of motion.  Cardiovascular: Normal rate.  Pulmonary/Chest: Effort normal.  Abdominal: Soft.  Musculoskeletal:  R knee no obvious effusion. Nl ROM. Able to bear weight on the leg, 2+ pulses, able to wiggle toes and neurovascular intact RLE   Neurological: He is alert and oriented to person, place, and time.  Skin: Skin is warm.  Psychiatric: He has a normal mood and affect.  Nursing note and vitals reviewed.    ED Treatments / Results  Labs (all labs ordered are listed, but only abnormal results are displayed) Labs Reviewed - No data to display  EKG  EKG Interpretation None       Radiology No results found.  Procedures Procedures (including critical care time)  Medications Ordered in ED Medications - No data to display   Initial Impression / Assessment and Plan / ED Course  I have reviewed the triage vital signs and  the nursing notes.  Pertinent labs & imaging results that were available during my care of the patient were reviewed by me and considered in my medical decision making (see chart for details).  Clinical Course   Andrew Warner is a 65 y.o. male here with persistent R knee pain. No obvious effusion, no trauma. Recent xray showed arthritis. Likely worsening pain from arthritis. Neurovascular intact, no hx of gout. Will not need another xray. Since pain not controlled with tramadol, diclofenac, will give 5 vicodin to go home. Has ortho f/u next week and may benefit from steroid injections. Has crutches at home and recommend try to stay off the leg.     Final Clinical Impressions(s) / ED Diagnoses   Final diagnoses:  None    New Prescriptions New Prescriptions   No medications on file     Charlynne Pander, MD 03/20/16 [phone]

## 2016-04-26 ENCOUNTER — Ambulatory Visit: Payer: Medicare Other | Admitting: Neurology

## 2016-06-28 ENCOUNTER — Encounter (HOSPITAL_BASED_OUTPATIENT_CLINIC_OR_DEPARTMENT_OTHER): Payer: Self-pay | Admitting: Emergency Medicine

## 2016-06-28 ENCOUNTER — Emergency Department (HOSPITAL_BASED_OUTPATIENT_CLINIC_OR_DEPARTMENT_OTHER)
Admission: EM | Admit: 2016-06-28 | Discharge: 2016-06-28 | Disposition: A | Discharge status: Home / Self Care | Payer: Medicare Other | Attending: Emergency Medicine | Admitting: Emergency Medicine

## 2016-06-28 DIAGNOSIS — F17210 Nicotine dependence, cigarettes, uncomplicated: Secondary | ICD-10-CM | POA: Diagnosis not present

## 2016-06-28 DIAGNOSIS — G43011 Migraine without aura, intractable, with status migrainosus: Secondary | ICD-10-CM | POA: Diagnosis not present

## 2016-06-28 DIAGNOSIS — R51 Headache: Secondary | ICD-10-CM | POA: Diagnosis present

## 2016-06-28 MED ORDER — SODIUM CHLORIDE 0.9 % IV BOLUS (SEPSIS)
1000.0000 mL | Freq: Once | INTRAVENOUS | Status: AC
  Administered 2016-06-28: 1000 mL via INTRAVENOUS

## 2016-06-28 MED ORDER — PROMETHAZINE HCL 25 MG/ML IJ SOLN
12.5000 mg | Freq: Once | INTRAMUSCULAR | Status: AC
  Administered 2016-06-28: 12.5 mg via INTRAVENOUS
  Filled 2016-06-28: qty 1

## 2016-06-28 MED ORDER — SODIUM CHLORIDE 0.9 % IV SOLN: INTRAVENOUS | Status: DC

## 2016-06-28 MED ORDER — KETOROLAC TROMETHAMINE 15 MG/ML IJ SOLN
15.0000 mg | Freq: Once | INTRAMUSCULAR | Status: AC
  Administered 2016-06-28: 15 mg via INTRAVENOUS
  Filled 2016-06-28: qty 1

## 2016-06-28 MED ORDER — DIPHENHYDRAMINE HCL 50 MG/ML IJ SOLN
25.0000 mg | Freq: Once | INTRAMUSCULAR | Status: AC
  Administered 2016-06-28: 25 mg via INTRAVENOUS
  Filled 2016-06-28: qty 1

## 2016-06-28 MED ORDER — DEXAMETHASONE SODIUM PHOSPHATE 10 MG/ML IJ SOLN
10.0000 mg | Freq: Once | INTRAMUSCULAR | Status: AC
  Administered 2016-06-28: 10 mg via INTRAVENOUS
  Filled 2016-06-28: qty 1

## 2016-06-28 NOTE — Discharge Instructions (Signed)
Follow-up with neurology as scheduled by your primary care doctor. Return for any new or worse symptoms. Go home and rest today.

## 2016-06-28 NOTE — ED Triage Notes (Signed)
Pt dx with migraines 6 months ago and states he started having one last night

## 2016-06-28 NOTE — ED Provider Notes (Signed)
[facility]   CSN: [id number] Arrival date & time: 06/28/16  0732     History   Chief Complaint Chief Complaint  Patient presents with  . Headache    HPI Thermon LeylandMelvin Warner is a 65 y.o. male.  Patient with new diagnosis of migraines sometime in the last 6 months. Patient has had an MRI based on [facility] records recently that has been negative. Patient talks about headache ongoing for about a week. Saw primary care doctor yesterday. Had some outpatient medication changes. Headache got worse during the night. Not associated with nausea vomiting or any visual changes. No neurological deficits. Patient does have a history of esophageal cancer. [facility]. Patient's primary care doctor has referred him to neurology but he has not been seen by them yet. Headache is typical for what's been occurring.    Patient states that headache is 10 out of 10 but appears in no acute distress. Headache is predominantly right-sided. Not associated with nausea or vomiting. No fevers. No visual changes no neuro deficits.  Past Medical History:  Diagnosis Date  . Migraines     There are no active problems to display for this patient.   History reviewed. No pertinent surgical history.     Home Medications    Prior to Admission medications   Medication Sig Start Date End Date Taking? Authorizing Provider  diclofenac sodium (VOLTAREN) 1 % GEL Apply 4 g topically 4 (four) times daily. 03/02/16   Shawn C Joy, PA-C  HYDROcodone-acetaminophen (NORCO/VICODIN) 5-325 MG tablet Take 1 tablet by mouth every 6 (six) hours as needed. 03/20/16   Charlynne Panderavid Hsienta Yao, MD  ibuprofen (ADVIL,MOTRIN) 800 MG tablet Take 1 tablet (800 mg total) by mouth 3 (three) times daily. 03/20/16   Charlynne Panderavid Hsienta Yao, MD  SUMAtriptan (IMITREX) 50 MG tablet Take 1 tablet (50 mg total) by mouth every 2 (two) hours as needed for migraine. May repeat in 2 hours if headache  persists or recurs. 02/24/16   Lavera Guiseana Duo Liu, MD  traMADol (ULTRAM) 50 MG tablet Take 1 tablet (50 mg total) by mouth every 6 (six) hours as needed. 02/13/16   Rolan BuccoMelanie Belfi, MD  varenicline (CHANTIX PAK) 0.5 MG X 11 & 1 MG X 42 tablet Take by mouth 2 (two) times daily. Take one 0.5 mg tablet by mouth once daily for 3 days, then increase to one 0.5 mg tablet twice daily for 4 days, then increase to one 1 mg tablet twice daily.    Historical Provider, MD    Family History History reviewed. No pertinent family history.  Social History Social History  Substance Use Topics  . Smoking status: Current Some Day Smoker    Packs/day: 1.00    Types: Cigarettes  . Smokeless tobacco: Never Used  . Alcohol use Yes     Comment: several times/week     Allergies   Patient has no known allergies.   Review of Systems Review of Systems  Constitutional: Negative for fever.  HENT: Negative for congestion.   Eyes: Negative for photophobia and visual disturbance.  Respiratory: Negative for shortness of breath.   Cardiovascular: Negative for chest pain.  Gastrointestinal: Negative for abdominal pain, nausea and vomiting.  Genitourinary: Negative for dysuria and hematuria.  Musculoskeletal: Negative for back pain and neck pain.  Skin: Negative for rash.  Neurological: Positive for headaches.  Hematological: Does not bruise/bleed easily.  Psychiatric/Behavioral: Negative for confusion.     Physical Exam Updated  Vital Signs BP 151/96 (BP Location: Left Wrist)   Pulse 67   Temp 97.4 F (36.3 C) (Oral)   Resp 18   Ht 6\' 3"  (1.905 m)   Wt 104.3 kg   SpO2 97%   BMI 28.75 kg/m   Physical Exam  Constitutional: He is oriented to person, place, and time. He appears well-developed and well-nourished. No distress.  HENT:  Head: Normocephalic and atraumatic.  Mouth/Throat: Oropharynx is clear and moist.  Eyes: Conjunctivae and EOM are normal. Pupils are equal, round, and reactive to light.  Neck:  Normal range of motion. Neck supple.  Cardiovascular: Normal rate and regular rhythm.   No murmur heard. Pulmonary/Chest: Effort normal and breath sounds normal. No respiratory distress.  Abdominal: Soft. Bowel sounds are normal. There is no tenderness.  Musculoskeletal: Normal range of motion.  Neurological: He is alert and oriented to person, place, and time. No cranial nerve deficit or sensory deficit. He exhibits normal muscle tone. Coordination normal.  Skin: Skin is warm.  Nursing note and vitals reviewed.    ED Treatments / Results  Labs (all labs ordered are listed, but only abnormal results are displayed) Labs Reviewed - No data to display  EKG  EKG Interpretation None       Radiology No results found.  Procedures Procedures (including critical care time)  Medications Ordered in ED Medications  0.9 %  sodium chloride infusion (not administered)  sodium chloride 0.9 % bolus 1,000 mL (1,000 mLs Intravenous New Bag/Given 06/28/16 0815)  dexamethasone (DECADRON) injection 10 mg (10 mg Intravenous Given 06/28/16 0816)  diphenhydrAMINE (BENADRYL) injection 25 mg (25 mg Intravenous Given 06/28/16 0815)  promethazine (PHENERGAN) injection 12.5 mg (12.5 mg Intravenous Given 06/28/16 0816)  ketorolac (TORADOL) 15 MG/ML injection 15 mg (15 mg Intravenous Given 06/28/16 0816)     Initial Impression / Assessment and Plan / ED Course  I have reviewed the triage vital signs and the nursing notes.  Pertinent labs & imaging results that were available during my care of the patient were reviewed by me and considered in my medical decision making (see chart for details).  Clinical Course     Patient with new diagnosis of migraines clinically for the past 6 months. Has had an MRI in recent months which were negative as per notes by [facility] primary care physician. Neurology consult pending. Patient being treated with migraine medications. Seen by primary care doctor yesterday.  Symptoms got worse. Typical for what he's had in the past.  Significant improvement with migraine cocktail which included Decadron toward all Benadryl and Phenergan.  Final Clinical Impressions(s) / ED Diagnoses   Final diagnoses:  Intractable migraine without aura and with status migrainosus    New Prescriptions New Prescriptions   No medications on file     Vanetta MuldersScott Jameir Ake, MD 06/28/16 [phone]

## 2016-06-28 NOTE — ED Notes (Signed)
MD at bedside for pt eval.
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
## 2021-06-11 ENCOUNTER — Emergency Department (HOSPITAL_BASED_OUTPATIENT_CLINIC_OR_DEPARTMENT_OTHER): Payer: Medicare Other

## 2021-06-11 ENCOUNTER — Emergency Department (HOSPITAL_BASED_OUTPATIENT_CLINIC_OR_DEPARTMENT_OTHER)
Admission: EM | Admit: 2021-06-11 | Discharge: 2021-06-11 | Disposition: A | Discharge status: Home / Self Care | Payer: Medicare Other | Attending: Emergency Medicine | Admitting: Emergency Medicine

## 2021-06-11 ENCOUNTER — Other Ambulatory Visit: Payer: Self-pay

## 2021-06-11 ENCOUNTER — Encounter (HOSPITAL_BASED_OUTPATIENT_CLINIC_OR_DEPARTMENT_OTHER): Payer: Self-pay | Admitting: Emergency Medicine

## 2021-06-11 DIAGNOSIS — I509 Heart failure, unspecified: Secondary | ICD-10-CM | POA: Insufficient documentation

## 2021-06-11 DIAGNOSIS — R2242 Localized swelling, mass and lump, left lower limb: Secondary | ICD-10-CM | POA: Insufficient documentation

## 2021-06-11 DIAGNOSIS — R2241 Localized swelling, mass and lump, right lower limb: Secondary | ICD-10-CM | POA: Insufficient documentation

## 2021-06-11 DIAGNOSIS — R609 Edema, unspecified: Secondary | ICD-10-CM

## 2021-06-11 DIAGNOSIS — I110 Hypertensive heart disease with heart failure: Secondary | ICD-10-CM | POA: Insufficient documentation

## 2021-06-11 DIAGNOSIS — F17210 Nicotine dependence, cigarettes, uncomplicated: Secondary | ICD-10-CM | POA: Diagnosis not present

## 2021-06-11 DIAGNOSIS — R600 Localized edema: Secondary | ICD-10-CM | POA: Diagnosis present

## 2021-06-11 HISTORY — DX: Heart failure, unspecified: I50.9

## 2021-06-11 HISTORY — DX: Essential (primary) hypertension: I10

## 2021-06-11 NOTE — ED Provider Notes (Signed)
[facility] EMERGENCY DEPARTMENT Provider Note   CSN: [id number] Arrival date & time: 06/11/21  1122     History Chief Complaint  Patient presents with   Leg Swelling    Andrew Warner is a 70 y.o. male.  The history is provided by the patient.  Illness Location:  Legs Quality:  Swelling Severity:  Mild Onset quality:  Gradual Timing:  Intermittent Progression:  Waxing and waning Chronicity:  Recurrent Context:  Patient concern for a blood clot after reading about leg swelling.  He has been having leg swelling intermittently for the last year.  He is on a fluid pill. Relieved by:  Nothing Worsened by:  Nothing Associated symptoms: no abdominal pain, no chest pain, no congestion, no cough, no diarrhea, no ear pain, no fatigue, no fever, no headaches, no myalgias, no nausea, no rash, no rhinorrhea, no shortness of breath, no sore throat and no vomiting       Past Medical History:  Diagnosis Date   CHF (congestive heart failure) (HCC)    Hypertension    Migraines     There are no problems to display for this patient.   History reviewed. No pertinent surgical history.     No family history on file.  Social History   Tobacco Use   Smoking status: Every Day    Packs/day: 1.00    Types: Cigarettes   Smokeless tobacco: Never  Vaping Use   Vaping Use: Never used  Substance Use Topics   Alcohol use: Yes    Comment: several times/week   Drug use: No    Home Medications Prior to Admission medications   Medication Sig Start Date End Date Taking? Authorizing Provider  diclofenac sodium (VOLTAREN) 1 % GEL Apply 4 g topically 4 (four) times daily. 03/02/16   Joy, Shawn C, PA-C  HYDROcodone-acetaminophen (NORCO/VICODIN) 5-325 MG tablet Take 1 tablet by mouth every 6 (six) hours as needed. 03/20/16   Charlynne Pander, MD  ibuprofen (ADVIL,MOTRIN) 800 MG tablet Take 1 tablet (800 mg total) by mouth 3 (three) times daily. 03/20/16   Charlynne Pander, MD   SUMAtriptan (IMITREX) 50 MG tablet Take 1 tablet (50 mg total) by mouth every 2 (two) hours as needed for migraine. May repeat in 2 hours if headache persists or recurs. 02/24/16   Lavera Guise, MD  traMADol (ULTRAM) 50 MG tablet Take 1 tablet (50 mg total) by mouth every 6 (six) hours as needed. 02/13/16   Rolan Bucco, MD  varenicline (CHANTIX PAK) 0.5 MG X 11 & 1 MG X 42 tablet Take by mouth 2 (two) times daily. Take one 0.5 mg tablet by mouth once daily for 3 days, then increase to one 0.5 mg tablet twice daily for 4 days, then increase to one 1 mg tablet twice daily.    [provider]    Allergies    Patient has no known allergies.  Review of Systems   Review of Systems  Constitutional:  Negative for chills, fatigue and fever.  HENT:  Negative for congestion, ear pain, rhinorrhea and sore throat.   Eyes:  Negative for pain and visual disturbance.  Respiratory:  Negative for cough and shortness of breath.   Cardiovascular:  Positive for leg swelling. Negative for chest pain and palpitations.  Gastrointestinal:  Negative for abdominal pain, diarrhea, nausea and vomiting.  Genitourinary:  Negative for dysuria and hematuria.  Musculoskeletal:  Negative for arthralgias, back pain and myalgias.  Skin:  Negative for color  change and rash.  Neurological:  Negative for seizures, syncope and headaches.  All other systems reviewed and are negative.  Physical Exam Updated Vital Signs BP (!) 163/78 (BP Location: Right Arm)   Pulse 62   Temp 98.7 F (37.1 C) (Oral)   Resp 18   Ht 6\' 3"  (1.905 m)   Wt 104.3 kg   SpO2 97%   BMI 28.75 kg/m   Physical Exam Vitals and nursing note reviewed.  Constitutional:      Appearance: He is well-developed.  HENT:     Head: Normocephalic and atraumatic.     Nose: Nose normal.     Mouth/Throat:     Mouth: Mucous membranes are moist.  Eyes:     Conjunctiva/sclera: Conjunctivae normal.     Pupils: Pupils are equal, round, and reactive to  light.  Cardiovascular:     Rate and Rhythm: Normal rate and regular rhythm.     Pulses: Normal pulses.     Heart sounds: Normal heart sounds. No murmur heard. Pulmonary:     Effort: Pulmonary effort is normal. No respiratory distress.     Breath sounds: Normal breath sounds.  Abdominal:     Palpations: Abdomen is soft.     Tenderness: There is no abdominal tenderness.  Musculoskeletal:     Cervical back: Neck supple.     Right lower leg: Edema (2+ pitting) present.     Left lower leg: Edema (1+ pitting) present.  Skin:    General: Skin is warm and dry.     Capillary Refill: Capillary refill takes less than 2 seconds.  Neurological:     Mental Status: He is alert.    ED Results / Procedures / Treatments   Labs (all labs ordered are listed, but only abnormal results are displayed) Labs Reviewed - No data to display  EKG None  Radiology Venous Img Lower Right (DVT Study)  Result Date: 06/11/2021 CLINICAL DATA:  21-year-old male with right lower extremity swelling for 3 months. EXAM: RIGHT LOWER EXTREMITY VENOUS DOPPLER ULTRASOUND TECHNIQUE: Gray-scale sonography with graded compression, as well as color Doppler and duplex ultrasound were performed to evaluate the right lower extremity deep venous systems from the level of the common femoral vein and including the common femoral, femoral, profunda femoral, popliteal and calf veins including the posterior tibial, peroneal and gastrocnemius veins when visible. Spectral Doppler was utilized to evaluate flow at rest and with distal augmentation maneuvers in the common femoral, femoral and popliteal veins. The contralateral common femoral vein was also evaluated for comparison. COMPARISON:  None. FINDINGS: RIGHT LOWER EXTREMITY Common Femoral Vein: No evidence of thrombus. Normal compressibility, respiratory phasicity and response to augmentation. Central Greater Saphenous Vein: No evidence of thrombus. Normal compressibility and flow on  color Doppler imaging. Central Profunda Femoral Vein: No evidence of thrombus. Normal compressibility and flow on color Doppler imaging. Femoral Vein: No evidence of thrombus. Normal compressibility, respiratory phasicity and response to augmentation. Popliteal Vein: No evidence of thrombus. Normal compressibility, respiratory phasicity and response to augmentation. Calf Veins: No evidence of thrombus. Normal compressibility and flow on color Doppler imaging. Other Findings:  Subcutaneous edema is noted about the calf. LEFT LOWER EXTREMITY Common Femoral Vein: No evidence of thrombus. Normal compressibility, respiratory phasicity and response to augmentation. IMPRESSION: No evidence of right lower extremity deep venous thrombosis. 9, MD [facility] Electronically Signed   By: [facility] M.D.   On: 06/11/2021 12:36  DG Chest Portable 1 View  Result Date: 06/11/2021 CLINICAL DATA:  A 70 year old male presents with history of leg swelling for 2-3 months. EXAM: PORTABLE CHEST 1 VIEW COMPARISON:  October 05, 2016. FINDINGS: Cardiomediastinal contours and hilar structures are stable. Lungs are clear. No sign of effusion. No visible pneumothorax. On limited assessment there is no acute skeletal process. IMPRESSION: No acute cardiopulmonary disease. Electronically Signed   By: Donzetta Kohut M.D.   On: 06/11/2021 12:47    Procedures Procedures   Medications Ordered in ED Medications - No data to display  ED Course  I have reviewed the triage vital signs and the nursing notes.  Pertinent labs & imaging results that were available during my care of the patient were reviewed by me and considered in my medical decision making (see chart for details).    MDM Rules/Calculators/A&P                           Andrew Warner is here for evaluation of right lower leg swelling.  Concern for DVT.  Unremarkable vitals.  No fever.  He is on a  fluid pill for the same.  He states that his legs will swell every once in a while.  She had some lab work 2 months ago that showed some mild CKD but creatinine has been at baseline for a while.  He had a DVT study done today that showed no blood clot.  No respiratory symptoms.  No signs of volume overload on his lungs on exam and is well on chest x-ray.  Offered him repeat lab work today to recheck his renal function but he will just follow-up with his primary care doctor.  Overall suspect that this is chronic venous stasis versus CKD causing the swelling in his legs intermittently.  We will have him double his Lasix dose for the next 2 days and then follow-up with primary care doctor for repeat lab work and repeat evaluation.  His BNP in the past has been normal but he would likely benefit from an echocardiogram as well.  Discharged in ED in good condition.  Understands return precautions.  This chart was dictated using voice recognition software.  Despite best efforts to proofread,  errors can occur which can change the documentation meaning.   Final Clinical Impression(s) / ED Diagnoses Final diagnoses:  Peripheral edema    Rx / DC Orders ED Discharge Orders     None        Virgina Norfolk, DO 06/11/21 1320

## 2021-06-11 NOTE — Discharge Instructions (Signed)
Recommend that you double your dose of Lasix for the next 2 days and then go back to your normal dose.  Follow-up with your primary care doctor as discussed.

## 2021-06-11 NOTE — ED Triage Notes (Signed)
Pt c/o B/L leg swelling ongoing 2-3 months. Pt tried compression type socks from [employer]. Pt then read on the Internet about blood clots and patient became concerned. Pt denies chest pain or shortness of breath. Pt does not stand for long periods of time. Pt had a four hour car ride to [place] where he stopped one time to pick up a friend.

## 2023-09-12 ENCOUNTER — Emergency Department (HOSPITAL_BASED_OUTPATIENT_CLINIC_OR_DEPARTMENT_OTHER)
Admission: EM | Admit: 2023-09-12 | Discharge: 2023-09-12 | Disposition: A | Discharge status: Home / Self Care | Payer: Medicare Other | Attending: Emergency Medicine | Admitting: Emergency Medicine

## 2023-09-12 ENCOUNTER — Encounter (HOSPITAL_BASED_OUTPATIENT_CLINIC_OR_DEPARTMENT_OTHER): Payer: Self-pay

## 2023-09-12 ENCOUNTER — Other Ambulatory Visit: Payer: Self-pay

## 2023-09-12 DIAGNOSIS — M7989 Other specified soft tissue disorders: Secondary | ICD-10-CM | POA: Insufficient documentation

## 2023-09-12 DIAGNOSIS — R42 Dizziness and giddiness: Secondary | ICD-10-CM | POA: Diagnosis present

## 2023-09-12 DIAGNOSIS — I951 Orthostatic hypotension: Secondary | ICD-10-CM | POA: Insufficient documentation

## 2023-09-12 LAB — BASIC METABOLIC PANEL
Anion gap: 7 (ref 5–15)
BUN: 29 mg/dL — ABNORMAL HIGH (ref 8–23)
CO2: 28 mmol/L (ref 22–32)
Calcium: 9.6 mg/dL (ref 8.9–10.3)
Chloride: 99 mmol/L (ref 98–111)
Creatinine, Ser: 1.42 mg/dL — ABNORMAL HIGH (ref 0.61–1.24)
GFR, Estimated: 53 mL/min — ABNORMAL LOW (ref >60)
Glucose, Bld: 102 mg/dL — ABNORMAL HIGH (ref 70–99)
Potassium: 4.6 mmol/L (ref 3.5–5.1)
Sodium: 134 mmol/L — ABNORMAL LOW (ref 135–145)

## 2023-09-12 LAB — URINALYSIS, ROUTINE W REFLEX MICROSCOPIC
Bilirubin Urine: NEGATIVE
Glucose, UA: NEGATIVE mg/dL
Hgb urine dipstick: NEGATIVE
Ketones, ur: NEGATIVE mg/dL
Nitrite: NEGATIVE
Protein, ur: 100 mg/dL — AB
Specific Gravity, Urine: 1.02 (ref 1.005–1.030)
pH: 6.5 (ref 5.0–8.0)

## 2023-09-12 LAB — URINALYSIS, MICROSCOPIC (REFLEX): RBC / HPF: NONE SEEN RBC/hpf (ref 0–5)

## 2023-09-12 LAB — CBC
HCT: 40.2 % (ref 39.0–52.0)
Hemoglobin: 13.1 g/dL (ref 13.0–17.0)
MCH: 33 pg (ref 26.0–34.0)
MCHC: 32.6 g/dL (ref 30.0–36.0)
MCV: 101.3 fL — ABNORMAL HIGH (ref 80.0–100.0)
Platelets: 181 10*3/uL (ref 150–400)
RBC: 3.97 MIL/uL — ABNORMAL LOW (ref 4.22–5.81)
RDW: 12.5 % (ref 11.5–15.5)
WBC: 10.2 10*3/uL (ref 4.0–10.5)
nRBC: 0 % (ref 0.0–0.2)

## 2023-09-12 LAB — CBG MONITORING, ED: Glucose-Capillary: 85 mg/dL (ref 70–99)

## 2023-09-12 MED ORDER — SODIUM CHLORIDE 0.9 % IV BOLUS
1000.0000 mL | Freq: Once | INTRAVENOUS | Status: AC
  Administered 2023-09-12: 1000 mL via INTRAVENOUS

## 2023-09-12 NOTE — ED Notes (Addendum)
EDP and Resident aware of pt. positive Orthostatic vitals

## 2023-09-12 NOTE — ED Triage Notes (Signed)
Pt c/o standing and felt like he was going to fall today. Pt reports it happened last week also. Pt denies any feeling of going to pass out, no dizziness or LOC.

## 2023-09-12 NOTE — Discharge Instructions (Addendum)
You came to the emergency department with concerns for imbalance when standing.  We tested you for orthostatic hypotension, which was positive.  This means that your blood pressure drops significantly when you stand up, causing the symptoms you have been experiencing.  As we discussed, it is important that you wear compression stockings regularly to help prevent this and to take care when standing.  We gave you 1 L of fluids with improvement of your symptoms.  We recommend you follow-up with your outpatient doctor for continued treatment of these symptoms.

## 2023-09-12 NOTE — ED Provider Notes (Signed)
[place] EMERGENCY DEPARTMENT AT [facility] Provider Note   CSN: [id number] Arrival date & time: 09/12/23  1713     History  Chief Complaint  Patient presents with   Gait Problem   Andrew Warner is a 73 y.o. male who presents with the sensation that he is going to fall while standing.  He says this is happened twice in the past week.  He says usually happens after he stands up.  He denies any lightheadedness or dizziness, vision changes, chest pain or palpitations.  He has not had any recent illnesses.  He does say that he usually wears compression stockings but has not been wearing them in the past 2 weeks.  He states he has been eating and drinking plenty.  He takes Lasix as needed for leg swelling, but has not taken it in several days.  Home Medications Prior to Admission medications   Medication Sig Start Date End Date Taking? Authorizing Provider  diclofenac sodium (VOLTAREN) 1 % GEL Apply 4 g topically 4 (four) times daily. 03/02/16   Joy, Shawn C, PA-C  HYDROcodone-acetaminophen (NORCO/VICODIN) 5-325 MG tablet Take 1 tablet by mouth every 6 (six) hours as needed. 03/20/16   Charlynne Pander, MD  ibuprofen (ADVIL,MOTRIN) 800 MG tablet Take 1 tablet (800 mg total) by mouth 3 (three) times daily. 03/20/16   Charlynne Pander, MD  SUMAtriptan (IMITREX) 50 MG tablet Take 1 tablet (50 mg total) by mouth every 2 (two) hours as needed for migraine. May repeat in 2 hours if headache persists or recurs. 02/24/16   Lavera Guise, MD  traMADol (ULTRAM) 50 MG tablet Take 1 tablet (50 mg total) by mouth every 6 (six) hours as needed. 02/13/16   Rolan Bucco, MD  varenicline (CHANTIX PAK) 0.5 MG X 11 & 1 MG X 42 tablet Take by mouth 2 (two) times daily. Take one 0.5 mg tablet by mouth once daily for 3 days, then increase to one 0.5 mg tablet twice daily for 4 days, then increase to one 1 mg tablet twice daily.    [provider]     Allergies    Patient has no known  allergies.    Review of Systems   Review of Systems  Constitutional: Negative.   HENT: Negative.    Eyes: Negative.   Respiratory: Negative.    Cardiovascular:  Positive for leg swelling.  Gastrointestinal: Negative.   Endocrine: Negative.   Genitourinary: Negative.   Musculoskeletal:  Positive for gait problem.  Skin: Negative.   Allergic/Immunologic: Negative.   Hematological: Negative.   Psychiatric/Behavioral: Negative.     Physical Exam Updated Vital Signs BP (!) 147/73   Pulse 64   Temp 97.9 F (36.6 C) (Oral)   Resp 17   Ht 6\' 3"  (1.905 m)   Wt 95.3 kg   SpO2 97%   BMI 26.25 kg/m  Physical Exam Constitutional:      Appearance: Normal appearance.  HENT:     Head: Normocephalic and atraumatic.     Mouth/Throat:     Mouth: Mucous membranes are moist.  Cardiovascular:     Rate and Rhythm: Normal rate and regular rhythm.  Pulmonary:     Effort: Pulmonary effort is normal.     Breath sounds: Normal breath sounds.  Abdominal:     General: Abdomen is flat.     Palpations: Abdomen is soft.  Musculoskeletal:        General: Normal range of motion.  Neurological:  General: No focal deficit present.     Mental Status: He is alert and oriented to person, place, and time.     Cranial Nerves: No cranial nerve deficit.     Motor: No weakness.    ED Results / Procedures / Treatments   Labs (all labs ordered are listed, but only abnormal results are displayed) Labs Reviewed  BASIC METABOLIC PANEL - Abnormal; Notable for the following components:      Result Value   Sodium 134 (*)    Glucose, Bld 102 (*)    BUN 29 (*)    Creatinine, Ser 1.42 (*)    GFR, Estimated 53 (*)    All other components within normal limits  CBC - Abnormal; Notable for the following components:   RBC 3.97 (*)    MCV 101.3 (*)    All other components within normal limits  URINALYSIS, ROUTINE W REFLEX MICROSCOPIC - Abnormal; Notable for the following components:   Protein, ur 100 (*)     Leukocytes,Ua TRACE (*)    All other components within normal limits  URINALYSIS, MICROSCOPIC (REFLEX) - Abnormal; Notable for the following components:   Bacteria, UA RARE (*)    All other components within normal limits  CBG MONITORING, ED   EKG None  Radiology No results found.  Medications Ordered in ED Medications  sodium chloride 0.9 % bolus 1,000 mL (0 mLs Intravenous Stopped 09/12/23 2123)    ED Course/ Medical Decision Making/ A&P Clinical Course as of 09/12/23 2147  Wed Sep 12, 2023  2028 BP(!): 98/56 [MJ]  2028 BP(!): 141/56 [MJ]  2028 BP(!): 98/56 [MJ]  2028 BP(!): 141/56 [MJ]    Clinical Course User Index [MJ] Annett Fabian, MD   Medical Decision Making Amount and/or Complexity of Data Reviewed Labs: ordered.  Andrew Warner is a 73 year old who presents for concerns of a sensation of imbalance when standing.  He is hemodynamically stable and there are no signs of systemic infection.  His orthostatics were positive. He is not on any medications that are likely causing this.  He does takes Lasix but it sounds like he takes it so intermittently at a low dose, so I doubt it is a significant contributor.  I think the main contributor to his orthostasis is his significant venous stasis, so I reminded him of the importance of wearing his compression stockings regularly and to be careful when he stands up. He may also be dehydrated, so we will give him fluids. His symptoms coincide with when he stopped wearing these two weeks ago. We rechecked his orthostatics after receiving 1 L of fluids and his symptoms are improved with standing. I instructed him to drink plenty of fluids, wear his compression stockings regularly, and to be extra careful when standing. He feels well enough for discharge and can follow-up with his outpatient primary care doctor.   Final Clinical Impression(s) / ED Diagnoses Final diagnoses:  Orthostatic hypotension      Annett Fabian,  MD 09/12/23 2147    Charlynne Pander, MD 09/13/23 1504

## 2023-12-24 ENCOUNTER — Emergency Department (HOSPITAL_BASED_OUTPATIENT_CLINIC_OR_DEPARTMENT_OTHER)
Admission: EM | Admit: 2023-12-24 | Discharge: 2023-12-24 | Disposition: A | Discharge status: Home / Self Care | Attending: Emergency Medicine | Admitting: Emergency Medicine

## 2023-12-24 ENCOUNTER — Other Ambulatory Visit (HOSPITAL_BASED_OUTPATIENT_CLINIC_OR_DEPARTMENT_OTHER): Payer: Self-pay

## 2023-12-24 ENCOUNTER — Emergency Department (HOSPITAL_BASED_OUTPATIENT_CLINIC_OR_DEPARTMENT_OTHER)

## 2023-12-24 ENCOUNTER — Other Ambulatory Visit: Payer: Self-pay

## 2023-12-24 ENCOUNTER — Encounter (HOSPITAL_BASED_OUTPATIENT_CLINIC_OR_DEPARTMENT_OTHER): Payer: Self-pay | Admitting: Emergency Medicine

## 2023-12-24 DIAGNOSIS — M546 Pain in thoracic spine: Secondary | ICD-10-CM | POA: Insufficient documentation

## 2023-12-24 MED ORDER — KETOROLAC TROMETHAMINE 15 MG/ML IJ SOLN
15.0000 mg | Freq: Once | INTRAMUSCULAR | Status: DC
  Filled 2023-12-24: qty 1

## 2023-12-24 MED ORDER — DICLOFENAC SODIUM 1 % EX GEL
4.0000 g | Freq: Four times a day (QID) | CUTANEOUS | 0 refills | Status: AC
  Filled 2023-12-24: qty 100, 20d supply, fill #0
  Filled 2023-12-24: qty 100, 25d supply, fill #0

## 2023-12-24 MED ORDER — METHOCARBAMOL 500 MG PO TABS
500.0000 mg | ORAL_TABLET | Freq: Two times a day (BID) | ORAL | 0 refills | Status: AC
  Filled 2023-12-24: qty 10, 5d supply, fill #0

## 2023-12-24 MED ORDER — ACETAMINOPHEN 500 MG PO TABS
1000.0000 mg | ORAL_TABLET | Freq: Once | ORAL | Status: DC
  Filled 2023-12-24: qty 2

## 2023-12-24 NOTE — ED Notes (Signed)
 Patient transported to X-ray

## 2023-12-24 NOTE — Discharge Instructions (Addendum)

## 2023-12-24 NOTE — ED Notes (Signed)
 Arrived to medicate pt and after education pt noted tylenol  would not do anything and he did not want shots. Medication returned to pyxis

## 2023-12-24 NOTE — ED Notes (Signed)
 In imaging pt noted that he wanted to now deny imaging as well.  Noted he ..."just needed some pain killers sent in that he could take at home."

## 2023-12-24 NOTE — ED Provider Notes (Signed)
 [place] EMERGENCY DEPARTMENT AT [facility] Provider Note   CSN: [id number] Arrival date & time: 12/24/23  1039     History  Chief Complaint  Patient presents with   Back Pain    Andrew Warner is a 73 y.o. male.  73 yo M with a cc of upper back pain.  This has been going on for a few days.  The patient was in the gym doing workouts and he stood up and felt this pain.  Since then has been worse with twisting turning palpation.  Seems to be worse when he tries to stand up.  Denies chest pain denies difficulty breathing denies abdominal pain denies radiation.  Denies any pain to his legs.  Denies loss of bowel or bladder denies loss of rectal sensation denies numbness or weakness to his legs.  Denies trauma.   Back Pain      Home Medications Prior to Admission medications   Medication Sig Start Date End Date Taking? Authorizing Provider  diclofenac  Sodium (VOLTAREN ) 1 % GEL Apply 4 g topically 4 (four) times daily. 12/24/23  Yes Albertus Hughs, DO  methocarbamol (ROBAXIN) 500 MG tablet Take 1 tablet (500 mg total) by mouth 2 (two) times daily. 12/24/23  Yes Albertus Hughs, DO  HYDROcodone -acetaminophen  (NORCO/VICODIN) 5-325 MG tablet Take 1 tablet by mouth every 6 (six) hours as needed. 03/20/16   Dalene Duck, MD  ibuprofen  (ADVIL ,MOTRIN ) 800 MG tablet Take 1 tablet (800 mg total) by mouth 3 (three) times daily. 03/20/16   Dalene Duck, MD  SUMAtriptan  (IMITREX ) 50 MG tablet Take 1 tablet (50 mg total) by mouth every 2 (two) hours as needed for migraine. May repeat in 2 hours if headache persists or recurs. 02/24/16   Liu, Dana Duo, MD  traMADol  (ULTRAM ) 50 MG tablet Take 1 tablet (50 mg total) by mouth every 6 (six) hours as needed. 02/13/16   Hershel Los, MD  varenicline (CHANTIX PAK) 0.5 MG X 11 & 1 MG X 42 tablet Take by mouth 2 (two) times daily. Take one 0.5 mg tablet by mouth once daily for 3 days, then increase to one 0.5 mg tablet twice daily for 4 days, then  increase to one 1 mg tablet twice daily.    [provider]      Allergies    Chantix [varenicline]    Review of Systems   Review of Systems  Musculoskeletal:  Positive for back pain.    Physical Exam Updated Vital Signs BP (!) 144/67 (BP Location: Right Arm)   Pulse 69   Temp 99.3 F (37.4 C) (Oral)   Resp 16   Ht 6\' 3"  (1.905 m)   Wt 95.3 kg   SpO2 98%   BMI 26.25 kg/m  Physical Exam Vitals and nursing note reviewed.  Constitutional:      Appearance: He is well-developed.  HENT:     Head: Normocephalic and atraumatic.  Eyes:     Pupils: Pupils are equal, round, and reactive to light.  Neck:     Vascular: No JVD.  Cardiovascular:     Rate and Rhythm: Normal rate and regular rhythm.     Heart sounds: No murmur heard.    No friction rub. No gallop.  Pulmonary:     Effort: No respiratory distress.     Breath sounds: No wheezing.  Abdominal:     General: There is no distension.     Tenderness: There is no abdominal tenderness. There is no  guarding or rebound.  Musculoskeletal:        General: Tenderness present. Normal range of motion.     Cervical back: Normal range of motion and neck supple.     Comments: Pain to the paraspinal musculature worse on the left side than the right side of the thoracic back.  No obvious midline spinal tenderness step-offs or deformities.  Pulse motor and sensation intact bilateral lower extremities.  Skin:    Coloration: Skin is not pale.     Findings: No rash.  Neurological:     Mental Status: He is alert and oriented to person, place, and time.  Psychiatric:        Behavior: Behavior normal.     ED Results / Procedures / Treatments   Labs (all labs ordered are listed, but only abnormal results are displayed) Labs Reviewed - No data to display  EKG None  Radiology No results found.  Procedures Procedures    Medications Ordered in ED Medications  ketorolac  (TORADOL ) 15 MG/ML injection 15 mg (15 mg  Intramuscular Patient Refused/Not Given 12/24/23 1120)  acetaminophen  (TYLENOL ) tablet 1,000 mg (1,000 mg Oral Patient Refused/Not Given 12/24/23 1120)    ED Course/ Medical Decision Making/ A&P                                 Medical Decision Making Amount and/or Complexity of Data Reviewed Radiology: ordered.  Risk OTC drugs. Prescription drug management.   73 yo M with a chief complaints of upper back pain.  This has been going on for a few days.  Patient goes to the gym fairly regularly and it occurred while he was in the gym.  Has tried Tylenol  but without improvement.  Likely musculoskeletal by history and physical.  Will obtain a screening chest x-ray.  Reassess.  Patient is declining imaging.  Will treat supportively.  PCP follow-up.  11:57 AM:  I have discussed the diagnosis/risks/treatment options with the patient.  Evaluation and diagnostic testing in the emergency department does not suggest an emergent condition requiring admission or immediate intervention beyond what has been performed at this time.  They will follow up with PCP. We also discussed returning to the ED immediately if new or worsening sx occur. We discussed the sx which are most concerning (e.g., sudden worsening pain, fever, inability to tolerate by mouth, cauda equina s/sx) that necessitate immediate return. Medications administered to the patient during their visit and any new prescriptions provided to the patient are listed below.  Medications given during this visit Medications  ketorolac  (TORADOL ) 15 MG/ML injection 15 mg (15 mg Intramuscular Patient Refused/Not Given 12/24/23 1120)  acetaminophen  (TYLENOL ) tablet 1,000 mg (1,000 mg Oral Patient Refused/Not Given 12/24/23 1120)     The patient appears reasonably screen and/or stabilized for discharge and I doubt any other medical condition or other [facility] requiring further screening, evaluation, or treatment in the ED at this time prior to discharge.           Final Clinical Impression(s) / ED Diagnoses Final diagnoses:  Acute bilateral thoracic back pain    Rx / DC Orders ED Discharge Orders          Ordered    methocarbamol (ROBAXIN) 500 MG tablet  2 times daily        12/24/23 1144    diclofenac  Sodium (VOLTAREN ) 1 % GEL  4 times daily  12/24/23 1144              Albertus Hughs, DO 12/24/23 1157

## 2023-12-24 NOTE — ED Triage Notes (Addendum)
 Back pain after working out last week either wed or Thursday hurts to move and walk ,  states has not fallen has taken OTC meds w/o relief

## 2024-01-03 ENCOUNTER — Other Ambulatory Visit (HOSPITAL_BASED_OUTPATIENT_CLINIC_OR_DEPARTMENT_OTHER): Payer: Self-pay
